# Patient Record
Sex: Male | Born: 1985 | Hispanic: Yes | Marital: Single | Smoking: Current every day smoker
Health system: Southern US, Community
[De-identification: ages and names within clinical notes are randomized; demographics above are authoritative.]

---

## 2015-11-28 ENCOUNTER — Emergency Department
Admission: EM | Admit: 2015-11-28 | Discharge: 2015-11-28 | Disposition: A | Discharge status: Home / Self Care | Payer: Self-pay | Attending: Emergency Medicine | Admitting: Emergency Medicine

## 2015-11-28 ENCOUNTER — Emergency Department: Payer: Self-pay

## 2015-11-28 ENCOUNTER — Encounter: Payer: Self-pay | Admitting: Emergency Medicine

## 2015-11-28 DIAGNOSIS — Y929 Unspecified place or not applicable: Secondary | ICD-10-CM | POA: Insufficient documentation

## 2015-11-28 DIAGNOSIS — Y999 Unspecified external cause status: Secondary | ICD-10-CM | POA: Insufficient documentation

## 2015-11-28 DIAGNOSIS — L03012 Cellulitis of left finger: Secondary | ICD-10-CM | POA: Insufficient documentation

## 2015-11-28 DIAGNOSIS — Z23 Encounter for immunization: Secondary | ICD-10-CM | POA: Insufficient documentation

## 2015-11-28 DIAGNOSIS — Y939 Activity, unspecified: Secondary | ICD-10-CM | POA: Insufficient documentation

## 2015-11-28 DIAGNOSIS — F17200 Nicotine dependence, unspecified, uncomplicated: Secondary | ICD-10-CM | POA: Insufficient documentation

## 2015-11-28 DIAGNOSIS — X58XXXA Exposure to other specified factors, initial encounter: Secondary | ICD-10-CM | POA: Insufficient documentation

## 2015-11-28 MED ORDER — TETANUS-DIPHTH-ACELL PERTUSSIS 5-2.5-18.5 LF-MCG/0.5 IM SUSP
0.5000 mL | Freq: Once | INTRAMUSCULAR | Status: AC
  Administered 2015-11-28: 0.5 mL via INTRAMUSCULAR
  Filled 2015-11-28: qty 0.5

## 2015-11-28 MED ORDER — HYDROCODONE-ACETAMINOPHEN 5-325 MG PO TABS: 1.0000 | ORAL_TABLET | ORAL | 0 refills | Status: AC | PRN

## 2015-11-28 MED ORDER — CEPHALEXIN 500 MG PO CAPS: 500.0000 mg | ORAL_CAPSULE | Freq: Four times a day (QID) | ORAL | 0 refills | Status: AC

## 2015-11-28 MED ORDER — CEPHALEXIN 500 MG PO CAPS
500.0000 mg | ORAL_CAPSULE | Freq: Once | ORAL | Status: AC
  Administered 2015-11-28: 500 mg via ORAL
  Filled 2015-11-28: qty 1

## 2015-11-28 MED ORDER — OXYCODONE HCL 5 MG PO TABS
5.0000 mg | ORAL_TABLET | Freq: Once | ORAL | Status: AC
  Administered 2015-11-28: 5 mg via ORAL
  Filled 2015-11-28: qty 1

## 2015-11-28 MED ORDER — NAPROXEN 500 MG PO TABS: 500.0000 mg | ORAL_TABLET | Freq: Two times a day (BID) | ORAL | 0 refills | Status: AC

## 2015-11-28 NOTE — ED Notes (Signed)
Pt to ED for blister to LFT thumb that started x1week, pt states increased swelling and pain to area xfew days ago. Pt has notable swelling and states some drainage from area. Pt c/o numbness to digit. Pt denies fevers at home.

## 2015-11-28 NOTE — ED Notes (Signed)
Pt. Verbalizes understanding of d/c instructions, prescriptions, and follow-up. VS stable and pain controlled per pt.  Pt. In NAD at time of d/c and denies further concerns regarding this visit. Pt. Ambulatory Out of the unit with steady gait with family. Pt. Displaying a positive mood, laughing and joking with family upon departure from the unit.

## 2015-11-28 NOTE — ED Notes (Signed)
FIRST NURSE NOTE: Patient with wound to his LEFT thumb. Patient reports that he had a "water blister" x 1 week ago that popped. Patient states, "I kept working and it got worse. I think it is infected".

## 2015-11-28 NOTE — ED Triage Notes (Addendum)
Pt says he was working about 12 days ago and moving rocks; incurred a small laceration to the pad of left thumb; now with swelling and drainage; pt says drainage started today after squeezing the area; denies fever at home; no warmth or redness noted to area; pt took Ibuprofen 2 hours pta which took pain from 10 to 8

## 2015-11-28 NOTE — ED Provider Notes (Signed)
[facility] Emergency Department Provider Note ____________________________________________  Time seen: Approximately 9:02 PM  I have reviewed the triage vital signs and the nursing notes.   HISTORY  Chief Complaint Finger Injury    HPI Paul Phelps is a 30 y.o. male who presents to the emergency department for evaluation of thumb pain. He states that about 12 days ago, he was moving rocks and developed a blister to his left thumb. He states that about 3 days ago the blister burst, but he continued to work. He states that the pain has been increasingly worse over the past 3 days and he has noticed swelling and some bleeding from a cut in the skin. He tells me he did not make an incision into his thumb and that it opened on its own. He has taken ibuprofen with minimal relief.  History reviewed. No pertinent past medical history.  There are no active problems to display for this patient.   History reviewed. No pertinent surgical history.  Prior to Admission medications   Medication Sig Start Date End Date Taking? Authorizing Provider  cephALEXin (KEFLEX) 500 MG capsule Take 1 capsule (500 mg total) by mouth 4 (four) times daily. 11/28/15 12/08/15  Chinita Pester, FNP  HYDROcodone-acetaminophen (NORCO/VICODIN) 5-325 MG tablet Take 1 tablet by mouth every 4 (four) hours as needed for moderate pain. 11/28/15 11/27/16  Chinita Pester, FNP  naproxen (NAPROSYN) 500 MG tablet Take 1 tablet (500 mg total) by mouth 2 (two) times daily with a meal. 11/28/15   Chinita Pester, FNP    Allergies Review of patient's allergies indicates no known allergies.  History reviewed. No pertinent family history.  Social History Social History  Substance Use Topics  . Smoking status: Current Every Day Smoker  . Smokeless tobacco: Never Used  . Alcohol use Yes    Review of Systems Constitutional: No recent illness. Cardiovascular: Denies chest pain or  palpitations. Respiratory: Denies shortness of breath. Musculoskeletal: Pain in Left thumb Skin: Positive for wound. Neurological: Negative for focal weakness or numbness.  ____________________________________________   PHYSICAL EXAM:  VITAL SIGNS: ED Triage Vitals  Enc Vitals Group     BP 11/28/15 2004 (!) 144/83     Pulse Rate 11/28/15 2004 89     Resp 11/28/15 2004 18     Temp 11/28/15 2004 98.5 F (36.9 C)     Temp Source 11/28/15 2004 Oral     SpO2 11/28/15 2004 100 %     Weight 11/28/15 2004 164 lb (74.4 kg)     Height 11/28/15 2004 5\' 11"  (1.803 m)     Head Circumference --      Peak Flow --      Pain Score 11/28/15 2005 8     Pain Loc --      Pain Edu? --      Excl. in GC? --     Constitutional: Alert and oriented. Well appearing and in no acute distress. Eyes: Conjunctivae are normal. EOMI. Head: Atraumatic. Neck: No stridor.  Respiratory: Normal respiratory effort.   Musculoskeletal: Tenderness over the pad of the left thumb with full range of motion of the joints of the left thumb and throughout the hand. Neurologic:  Normal speech and language. No gross focal neurologic deficits are appreciated. Speech is normal. No gait instability. Skin:  1.5 cm linear open lesion to the pad of the left thumb with surrounding induration and discoloration of the skin. No lymphangitis noted. Psychiatric: Mood and affect  are normal. Speech and behavior are normal.  ____________________________________________   LABS (all labs ordered are listed, but only abnormal results are displayed)  Labs Reviewed - No data to display ____________________________________________  RADIOLOGY  No indication of foreign body or osteomyelitis of the left thumb. ____________________________________________   PROCEDURES  Procedure(s) performed: None   ____________________________________________   INITIAL IMPRESSION / ASSESSMENT AND PLAN / ED COURSE  Pertinent labs & imaging  results that were available during my care of the patient were reviewed by me and considered in my medical decision making (see chart for details).  Patient was instructed to soak the finger in warm Epson salt water 4 times per day. He was given a prescription for Keflex and advised to take it until finished. He was given a prescription for Norco and Naprosyn to be taken as prescribed if needed for pain. He was instructed to follow-up with the primary care provider of his choice or orthopedics for symptoms that are not improving over the next 2 days. He was instructed to return to the emergency department for symptoms that change or worsen if he is unable schedule an appointment. ____________________________________________   FINAL CLINICAL IMPRESSION(S) / ED DIAGNOSES  Final diagnoses:  Cellulitis of left thumb       Chinita Pester, FNP 11/28/15 2239    Jennye Moccasin, MD 11/28/15 2328

## 2015-11-28 NOTE — Discharge Instructions (Signed)
Soak in warm Epson salt water 4 times per day. Follow up with orthopedics for symptoms that are not improving with the antibiotics and soaks.  Return to the ER for symptoms that change or worsen if unable to schedule an appointment with primary care or the orthopedist.

## 2017-06-06 IMAGING — DX DG FINGER THUMB 2+V*L*
3 series · 3 of 3 positions shown · non-contrast
Comparison: None.

CLINICAL DATA: 30-year-old male with infection and swelling of the
thumb. Evaluate for foreign object.

EXAM:
LEFT THUMB 2+V

[finger ap]
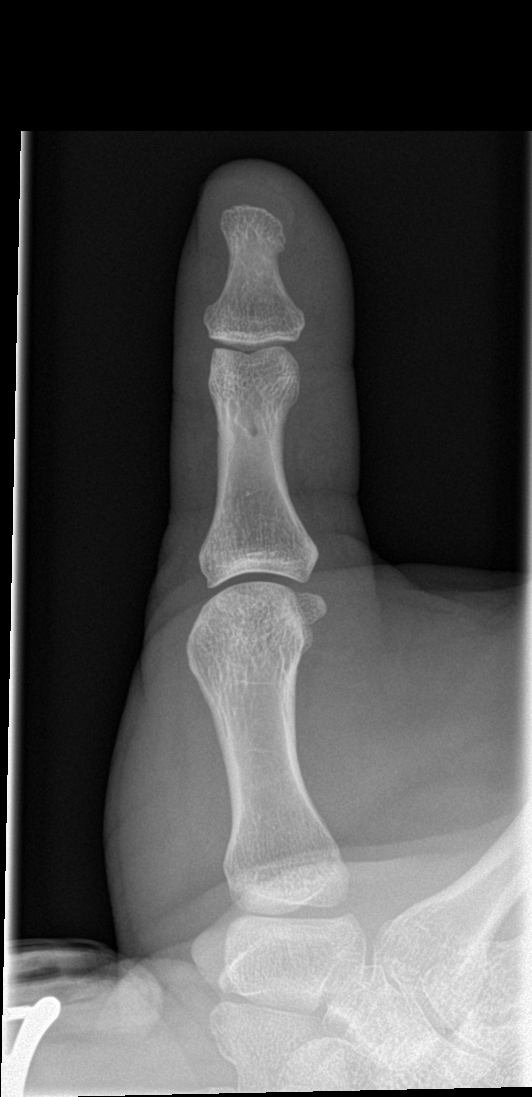

[finger obl]
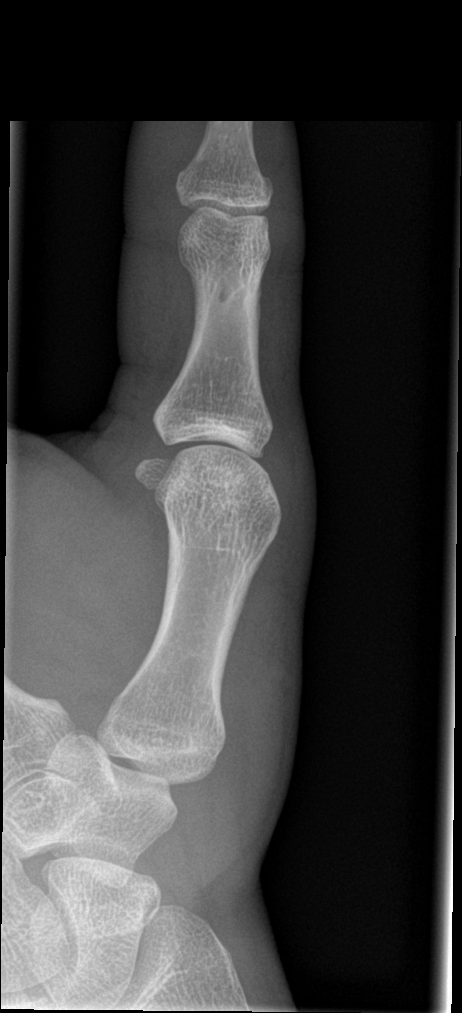

[finger lat]
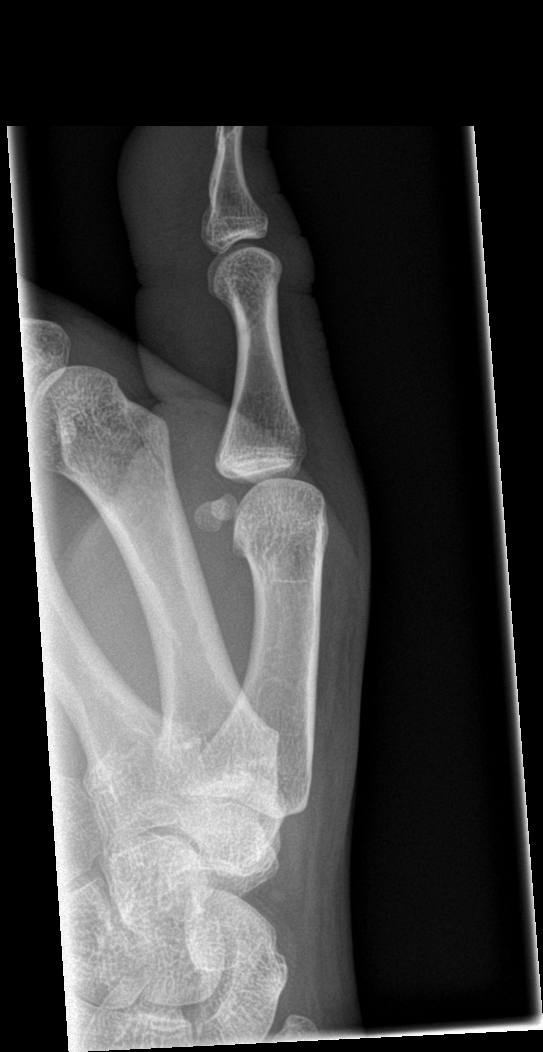

[3 of 3 positions shown; findings below may reference images not displayed]

FINDINGS: There is no acute fracture or dislocation. The bones are well
mineralized. No arthritic changes. There is no periosteal elevation
or bone erosion to suggest osteomyelitis. There is diffuse soft
tissue swelling of the thumb. No radiopaque foreign object or soft
tissue gas.
IMPRESSION: Diffuse soft tissue swelling of the palm.  No foreign object.
